# Patient Record
Sex: Female | Born: 2000 | Hispanic: Yes | Marital: Single | Smoking: Never smoker
Health system: Southern US, Community
[De-identification: ages and names within clinical notes are randomized; demographics above are authoritative.]

## PROBLEM LIST (undated history)

## (undated) ENCOUNTER — Inpatient Hospital Stay (HOSPITAL_COMMUNITY): Payer: Self-pay

## (undated) DIAGNOSIS — D649 Anemia, unspecified: Secondary | ICD-10-CM

## (undated) HISTORY — PX: NO PAST SURGERIES: SHX2092

---
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
## 2020-03-02 DIAGNOSIS — H5213 Myopia, bilateral: Secondary | ICD-10-CM | POA: Diagnosis not present

## 2020-05-12 IMAGING — CR DG ABDOMEN 1V
1 series · 1 of 1 positions shown · non-contrast
Comparison: None.

CLINICAL DATA: Abdominal pain

EXAM:
ABDOMEN - 1 VIEW

[abdomen kub]
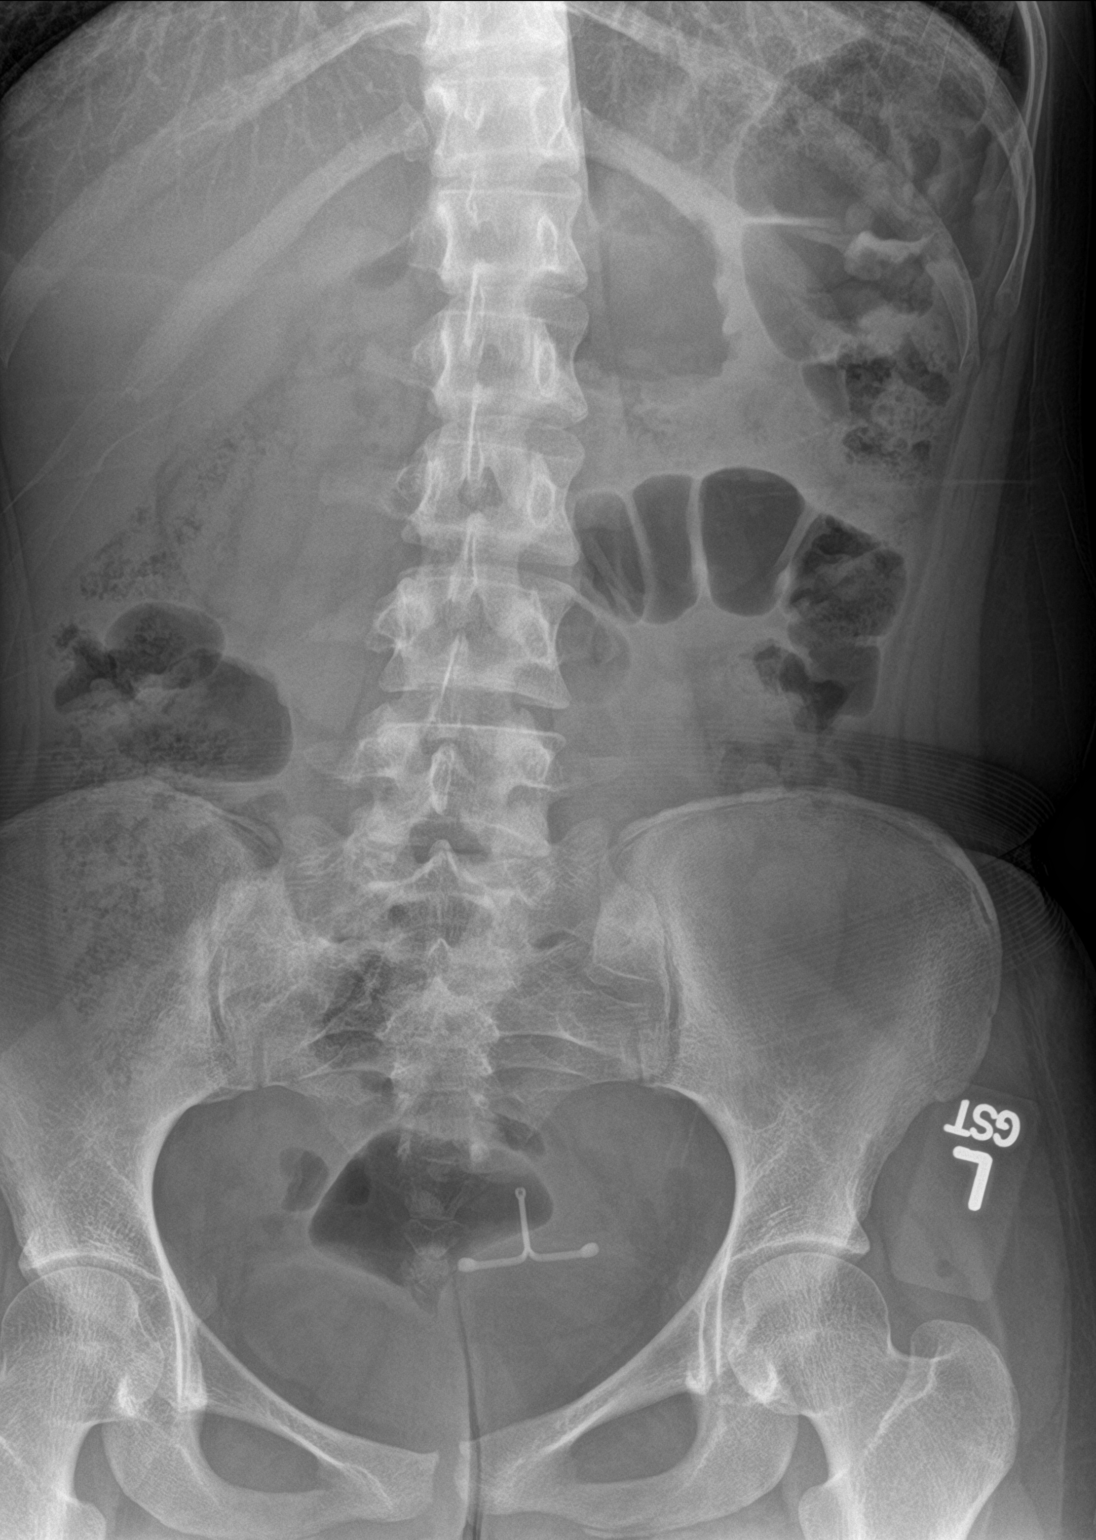

[1 of 1 positions shown; findings below may reference images not displayed]

FINDINGS: Scattered large and small bowel gas is noted. Mild retained fecal
material is noted although no obstructive changes are seen. IUD is
noted in place. No acute bony abnormality is noted.
IMPRESSION: No acute abnormality noted.  No significant constipation is seen.

## 2020-09-25 DIAGNOSIS — Z30431 Encounter for routine checking of intrauterine contraceptive device: Secondary | ICD-10-CM | POA: Diagnosis not present

## 2020-09-25 DIAGNOSIS — B373 Candidiasis of vulva and vagina: Secondary | ICD-10-CM | POA: Diagnosis not present

## 2020-09-25 DIAGNOSIS — Z113 Encounter for screening for infections with a predominantly sexual mode of transmission: Secondary | ICD-10-CM | POA: Diagnosis not present

## 2021-10-17 DIAGNOSIS — H5213 Myopia, bilateral: Secondary | ICD-10-CM | POA: Diagnosis not present

## 2022-02-27 DIAGNOSIS — Z0389 Encounter for observation for other suspected diseases and conditions ruled out: Secondary | ICD-10-CM | POA: Diagnosis not present

## 2022-02-27 DIAGNOSIS — Z30431 Encounter for routine checking of intrauterine contraceptive device: Secondary | ICD-10-CM | POA: Diagnosis not present

## 2022-02-27 DIAGNOSIS — Z01419 Encounter for gynecological examination (general) (routine) without abnormal findings: Secondary | ICD-10-CM | POA: Diagnosis not present

## 2022-02-27 DIAGNOSIS — Z113 Encounter for screening for infections with a predominantly sexual mode of transmission: Secondary | ICD-10-CM | POA: Diagnosis not present

## 2022-08-01 DIAGNOSIS — Z30431 Encounter for routine checking of intrauterine contraceptive device: Secondary | ICD-10-CM | POA: Diagnosis not present

## 2022-08-01 DIAGNOSIS — Z113 Encounter for screening for infections with a predominantly sexual mode of transmission: Secondary | ICD-10-CM | POA: Diagnosis not present

## 2023-04-23 DIAGNOSIS — R300 Dysuria: Secondary | ICD-10-CM | POA: Diagnosis not present

## 2023-04-23 DIAGNOSIS — R8290 Unspecified abnormal findings in urine: Secondary | ICD-10-CM | POA: Diagnosis not present

## 2023-04-23 DIAGNOSIS — N898 Other specified noninflammatory disorders of vagina: Secondary | ICD-10-CM | POA: Diagnosis not present

## 2024-04-14 DIAGNOSIS — L710 Perioral dermatitis: Secondary | ICD-10-CM | POA: Diagnosis not present

## 2024-05-12 ENCOUNTER — Other Ambulatory Visit: Payer: Self-pay

## 2024-05-12 ENCOUNTER — Emergency Department

## 2024-05-12 ENCOUNTER — Emergency Department
Admission: EM | Admit: 2024-05-12 | Discharge: 2024-05-12 | Disposition: A | Discharge status: Home / Self Care | Attending: Emergency Medicine | Admitting: Emergency Medicine

## 2024-05-12 ENCOUNTER — Encounter: Payer: Self-pay | Admitting: Emergency Medicine

## 2024-05-12 DIAGNOSIS — T148XXA Other injury of unspecified body region, initial encounter: Secondary | ICD-10-CM

## 2024-05-12 DIAGNOSIS — S4991XA Unspecified injury of right shoulder and upper arm, initial encounter: Secondary | ICD-10-CM | POA: Diagnosis present

## 2024-05-12 DIAGNOSIS — Y92410 Unspecified street and highway as the place of occurrence of the external cause: Secondary | ICD-10-CM | POA: Diagnosis not present

## 2024-05-12 DIAGNOSIS — M25531 Pain in right wrist: Secondary | ICD-10-CM | POA: Insufficient documentation

## 2024-05-12 DIAGNOSIS — S50811A Abrasion of right forearm, initial encounter: Secondary | ICD-10-CM | POA: Diagnosis not present

## 2024-05-12 DIAGNOSIS — M79601 Pain in right arm: Secondary | ICD-10-CM

## 2024-05-12 MED ORDER — LIDOCAINE 5 % EX PTCH
1.0000 | MEDICATED_PATCH | CUTANEOUS | Status: DC
  Administered 2024-05-12: 1 via TRANSDERMAL
  Filled 2024-05-12: qty 1

## 2024-05-12 MED ORDER — LIDOCAINE 5 % EX PTCH: 1.0000 | MEDICATED_PATCH | CUTANEOUS | 0 refills | Status: AC

## 2024-05-12 MED ORDER — ACETAMINOPHEN 500 MG PO TABS
1000.0000 mg | ORAL_TABLET | Freq: Once | ORAL | Status: AC
  Administered 2024-05-12: 1000 mg via ORAL
  Filled 2024-05-12: qty 2

## 2024-05-12 MED ORDER — IBUPROFEN 600 MG PO TABS
600.0000 mg | ORAL_TABLET | Freq: Once | ORAL | Status: AC
  Administered 2024-05-12: 600 mg via ORAL
  Filled 2024-05-12: qty 1

## 2024-05-12 NOTE — Discharge Instructions (Addendum)
 You can take 650 mg of Tylenol  for 100 mg ibuprofen  every 6 hours as needed for pain.  Also use the Lidoderm  patches as prescribed.
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
## 2024-06-30 DIAGNOSIS — Z0000 Encounter for general adult medical examination without abnormal findings: Secondary | ICD-10-CM | POA: Diagnosis not present

## 2024-06-30 DIAGNOSIS — Z113 Encounter for screening for infections with a predominantly sexual mode of transmission: Secondary | ICD-10-CM | POA: Diagnosis not present

## 2024-06-30 DIAGNOSIS — Z862 Personal history of diseases of the blood and blood-forming organs and certain disorders involving the immune mechanism: Secondary | ICD-10-CM | POA: Diagnosis not present

## 2024-07-23 DIAGNOSIS — L989 Disorder of the skin and subcutaneous tissue, unspecified: Secondary | ICD-10-CM | POA: Diagnosis not present

## 2024-07-23 DIAGNOSIS — R87610 Atypical squamous cells of undetermined significance on cytologic smear of cervix (ASC-US): Secondary | ICD-10-CM | POA: Diagnosis not present

## 2024-07-23 DIAGNOSIS — Z124 Encounter for screening for malignant neoplasm of cervix: Secondary | ICD-10-CM | POA: Diagnosis not present

## 2025-03-16 ENCOUNTER — Ambulatory Visit: Admitting: Physician Assistant
